# Patient Record
Sex: Female | Born: 1980 | Race: Black or African American | Hispanic: No | Smoking: Current every day smoker
Health system: Southern US, Community
[De-identification: ages and names within clinical notes are randomized; demographics above are authoritative.]

## PROBLEM LIST (undated history)

## (undated) HISTORY — PX: TUBAL LIGATION: SHX77

---

## 2004-11-08 ENCOUNTER — Inpatient Hospital Stay: Payer: Self-pay

## 2005-12-05 ENCOUNTER — Emergency Department: Payer: Self-pay | Admitting: Unknown Physician Specialty

## 2005-12-08 ENCOUNTER — Emergency Department: Payer: Self-pay | Admitting: Emergency Medicine

## 2006-09-17 ENCOUNTER — Emergency Department (HOSPITAL_COMMUNITY): Admission: EM | Admit: 2006-09-17 | Discharge: 2006-09-17 | Payer: Self-pay | Admitting: Emergency Medicine

## 2006-09-22 ENCOUNTER — Emergency Department (HOSPITAL_COMMUNITY): Admission: EM | Admit: 2006-09-22 | Discharge: 2006-09-22 | Payer: Self-pay | Admitting: Emergency Medicine

## 2007-09-22 ENCOUNTER — Emergency Department: Payer: Self-pay | Admitting: Emergency Medicine

## 2008-11-04 ENCOUNTER — Emergency Department (HOSPITAL_COMMUNITY): Admission: EM | Admit: 2008-11-04 | Discharge: 2008-11-04 | Payer: Self-pay | Admitting: Emergency Medicine

## 2009-05-04 ENCOUNTER — Emergency Department: Payer: Self-pay | Admitting: Emergency Medicine

## 2009-11-26 ENCOUNTER — Emergency Department: Payer: Self-pay | Admitting: Emergency Medicine

## 2010-07-06 ENCOUNTER — Emergency Department (HOSPITAL_COMMUNITY): Admission: EM | Admit: 2010-07-06 | Discharge: 2010-07-06 | Payer: Self-pay | Admitting: Family Medicine

## 2010-07-06 ENCOUNTER — Emergency Department: Payer: Self-pay | Admitting: Emergency Medicine

## 2011-12-19 ENCOUNTER — Emergency Department: Payer: Self-pay

## 2012-01-08 ENCOUNTER — Emergency Department: Payer: Self-pay | Admitting: Unknown Physician Specialty

## 2013-06-09 ENCOUNTER — Encounter (HOSPITAL_COMMUNITY): Payer: Self-pay | Admitting: Emergency Medicine

## 2013-06-09 ENCOUNTER — Emergency Department (HOSPITAL_COMMUNITY): Payer: Self-pay

## 2013-06-09 ENCOUNTER — Emergency Department (HOSPITAL_COMMUNITY)
Admission: EM | Admit: 2013-06-09 | Discharge: 2013-06-09 | Disposition: A | Discharge status: Home / Self Care | Payer: Self-pay | Attending: Emergency Medicine | Admitting: Emergency Medicine

## 2013-06-09 DIAGNOSIS — Y939 Activity, unspecified: Secondary | ICD-10-CM | POA: Insufficient documentation

## 2013-06-09 DIAGNOSIS — W108XXA Fall (on) (from) other stairs and steps, initial encounter: Secondary | ICD-10-CM | POA: Insufficient documentation

## 2013-06-09 DIAGNOSIS — Y929 Unspecified place or not applicable: Secondary | ICD-10-CM | POA: Insufficient documentation

## 2013-06-09 DIAGNOSIS — X500XXA Overexertion from strenuous movement or load, initial encounter: Secondary | ICD-10-CM | POA: Insufficient documentation

## 2013-06-09 DIAGNOSIS — M25561 Pain in right knee: Secondary | ICD-10-CM

## 2013-06-09 DIAGNOSIS — S8990XA Unspecified injury of unspecified lower leg, initial encounter: Secondary | ICD-10-CM | POA: Insufficient documentation

## 2013-06-09 DIAGNOSIS — F17200 Nicotine dependence, unspecified, uncomplicated: Secondary | ICD-10-CM | POA: Insufficient documentation

## 2013-06-09 MED ORDER — HYDROCODONE-ACETAMINOPHEN 5-325 MG PO TABS: 1.0000 | ORAL_TABLET | ORAL | Status: DC | PRN

## 2013-06-09 NOTE — ED Notes (Signed)
Pt transported to xray 

## 2013-06-09 NOTE — ED Notes (Signed)
Pt. Stated, I fell last night and hurt my rt. Knee.

## 2013-06-09 NOTE — ED Provider Notes (Signed)
CSN: [id number]     Arrival date & time 06/09/13  1416 History     First MD Initiated Contact with Patient 06/09/13 1425     Chief Complaint  Patient presents with  . Knee Pain   (Consider location/radiation/quality/duration/timing/severity/associated sxs/prior Treatment) HPI Comments: Patient reports she accidentally fell down 2-3 steps last night.  States her right knee twisted as she fell.  Reports pain and swelling of the right knee.  Denies weakness or numbness of the knee.  Denies hitting head or LOC.  Denies back or neck pain.  Pt took two aspirin with little relief.  Pt drove up from [place] today to be seen.    Patient is a 32 y.o. female presenting with knee pain. The history is provided by the patient.  Knee Pain Associated symptoms: no back pain, no fever and no neck pain     History reviewed. No pertinent past medical history. History reviewed. No pertinent past surgical history. No family history on file. History  Substance Use Topics  . Smoking status: Current Every Day Smoker  . Smokeless tobacco: Not on file  . Alcohol Use: Yes   OB History   Grav Para Term Preterm Abortions TAB SAB Ect Mult Living                 Review of Systems  Constitutional: Negative for fever.  HENT: Negative for neck pain.   Musculoskeletal: Positive for arthralgias. Negative for back pain.  Skin: Negative for color change.  Neurological: Negative for weakness and numbness.    Allergies  Review of patient's allergies indicates not on file.  Home Medications   Current Outpatient Rx  Name  Route  Sig  Dispense  Refill  . Aspirin-Caffeine (BC FAST PAIN RELIEF PO)   Oral   Take 2 packets by mouth as needed (pain).          BP 108/68  Pulse 83  Temp(Src) 97.7 F (36.5 C) (Oral)  Resp 15  SpO2 98%  LMP 05/26/2013 Physical Exam  Nursing note and vitals reviewed. Constitutional: She appears well-developed and well-nourished. No distress.  HENT:  Head: Normocephalic  and atraumatic.  Neck: Neck supple.  Pulmonary/Chest: Effort normal.  Musculoskeletal:       Right knee: She exhibits decreased range of motion and swelling. She exhibits no ecchymosis, no deformity, no laceration, normal alignment, no LCL laxity and no MCL laxity. Tenderness found. Medial joint line and lateral joint line tenderness noted.       Legs: Decreased AROM.  Tender to palpation medial and lateral to the patella.  Pain with valgus stress.  No pain or laxity with anterior or posterior drawer or varus stress.  Distal pulses and sensation intact.    Neurological: She is alert.  Skin: She is not diaphoretic.    ED Course   Procedures (including critical care time)  Labs Reviewed - No data to display Dg Knee Complete 4 Views Right  06/09/2013   *RADIOLOGY REPORT*  Clinical Data: Larey Seat down stairs.  Right knee pain.  RIGHT KNEE - COMPLETE 4+ VIEW  Comparison: None.  Findings: No fracture.  The knee joint is normally spaced and aligned.  There is no joint effusion.  The soft tissues are unremarkable.  IMPRESSION: Normal right knee radiographs.   Original Report Authenticated By: Amie Portland, M.D.   1. Right knee pain     MDM  Pt with knee injury yesterday - twisted and fell.  Neurovascularly intact.  Xray negative.  Pt placed in knee sleeve, crutches, RICE treatment at home. Ortho follow up  Discussed all results,  findings, treatment, follow up  with patient.  Pt given return precautions.  Pt verbalizes understanding and agrees with plan.     Trixie Dredge, PA-C 06/09/13 1529

## 2013-06-10 NOTE — ED Provider Notes (Signed)
History/physical exam/procedure(s) were performed by non-physician practitioner and as supervising physician I was immediately available for consultation/collaboration. I have reviewed all notes and am in agreement with care and plan.   Keyry Iracheta S Teriyah Purington, MD 06/10/13 1202 

## 2015-08-24 ENCOUNTER — Encounter (HOSPITAL_COMMUNITY): Payer: Self-pay | Admitting: *Deleted

## 2015-08-24 ENCOUNTER — Emergency Department (HOSPITAL_COMMUNITY): Payer: Medicaid Other

## 2015-08-24 ENCOUNTER — Emergency Department (HOSPITAL_COMMUNITY)
Admission: EM | Admit: 2015-08-24 | Discharge: 2015-08-24 | Disposition: A | Discharge status: Home / Self Care | Payer: Medicaid Other | Attending: Emergency Medicine | Admitting: Emergency Medicine

## 2015-08-24 DIAGNOSIS — Y9301 Activity, walking, marching and hiking: Secondary | ICD-10-CM | POA: Diagnosis not present

## 2015-08-24 DIAGNOSIS — S83209A Unspecified tear of unspecified meniscus, current injury, unspecified knee, initial encounter: Secondary | ICD-10-CM

## 2015-08-24 DIAGNOSIS — S83511A Sprain of anterior cruciate ligament of right knee, initial encounter: Secondary | ICD-10-CM | POA: Diagnosis not present

## 2015-08-24 DIAGNOSIS — W010XXA Fall on same level from slipping, tripping and stumbling without subsequent striking against object, initial encounter: Secondary | ICD-10-CM | POA: Insufficient documentation

## 2015-08-24 DIAGNOSIS — Z720 Tobacco use: Secondary | ICD-10-CM | POA: Insufficient documentation

## 2015-08-24 DIAGNOSIS — S8991XA Unspecified injury of right lower leg, initial encounter: Secondary | ICD-10-CM | POA: Diagnosis present

## 2015-08-24 DIAGNOSIS — Y999 Unspecified external cause status: Secondary | ICD-10-CM | POA: Insufficient documentation

## 2015-08-24 DIAGNOSIS — S83206A Unspecified tear of unspecified meniscus, current injury, right knee, initial encounter: Secondary | ICD-10-CM | POA: Insufficient documentation

## 2015-08-24 DIAGNOSIS — M25561 Pain in right knee: Secondary | ICD-10-CM

## 2015-08-24 DIAGNOSIS — Y9289 Other specified places as the place of occurrence of the external cause: Secondary | ICD-10-CM | POA: Insufficient documentation

## 2015-08-24 MED ORDER — IBUPROFEN 800 MG PO TABS: 800.0000 mg | ORAL_TABLET | Freq: Three times a day (TID) | ORAL | Status: AC

## 2015-08-24 MED ORDER — KETOROLAC TROMETHAMINE 60 MG/2ML IM SOLN
60.0000 mg | Freq: Once | INTRAMUSCULAR | Status: AC
  Administered 2015-08-24: 60 mg via INTRAMUSCULAR
  Filled 2015-08-24: qty 2

## 2015-08-24 MED ORDER — HYDROCODONE-ACETAMINOPHEN 5-325 MG PO TABS: 2.0000 | ORAL_TABLET | ORAL | Status: DC | PRN

## 2015-08-24 NOTE — ED Provider Notes (Signed)
Patient care signed out to me by Haynes DageHannah Patel Mills, PA-C. Please see her note for further detail. Plan is for follow-up on pending MRI. If there is substantial ligament damage, will need orthopedic follow-up, otherwise patent may be placed in knee immobilizer with crutches and pain medications. Filed Vitals:   08/24/15 1336  BP: 105/63  Pulse: 94  Temp: 98.2 F (36.8 C)  Resp: 18   Results of MRI shows extensive tearing of both medial and lateral menisci, complete anterior cruciate ligament tear with joint effusion. Grade 1 sprain of MCL. Will place patient in knee immobilizer, give crutches, short course pain medicines and referral to follow up with orthopedic surgery within the next 1-2 days. Patient verbalizes understanding. She voices no other questions or concerns at this time. Given strict return precautions.  Joycie PeekBenjamin Tareq Dwan, PA-C 08/24/15 1800  Pricilla LovelessScott Goldston, MD 08/26/15 [phone]

## 2015-08-24 NOTE — ED Provider Notes (Signed)
CSN: [id number]     Arrival date & time 08/24/15  1309 History  By signing my name below, I, Essence Howell, attest that this documentation has been prepared under the direction and in the presence of Catha GosselinHanna Patel-Mills, PA-C Electronically Signed: Charline BillsEssence Howell, ED Scribe 08/25/2015 at 7:12 AM.   Chief Complaint  Patient presents with  . Knee Injury   The history is provided by the patient. No language interpreter was used.   HPI Comments: Eugenia Mcalpineakeel Shantel Maertens is a 34 y.o. female who presents to the Emergency Department complaining of a right knee injury sustained 2 days ago. Pt states that she was walking when she slipped and fell backwards. She reports that she felt and heard a "pop" in her right knee during the fall. She further reports constant right knee pain that is exacerbated with movement and palpation, as well as right knee swelling. She has been non-weight bearing for the past 2 days. Pt has tried Aleve without relief.   History reviewed. No pertinent past medical history. Past Surgical History  Procedure Laterality Date  . Tubal ligation     No family history on file. Social History  Substance Use Topics  . Smoking status: Current Every Day Smoker  . Smokeless tobacco: None  . Alcohol Use: Yes   OB History    No data available     Review of Systems  Musculoskeletal: Positive for joint swelling and arthralgias.  All other systems reviewed and are negative.  Allergies  Review of patient's allergies indicates no known allergies.  Home Medications   Prior to Admission medications   Medication Sig Start Date End Date Taking? Authorizing Provider  HYDROcodone-acetaminophen (NORCO) 5-325 MG tablet Take 2 tablets by mouth every 4 (four) hours as needed. 08/24/15   Joycie PeekBenjamin Cartner, PA-C  ibuprofen (ADVIL,MOTRIN) 800 MG tablet Take 1 tablet (800 mg total) by mouth 3 (three) times daily. 08/24/15   Benjamin Cartner, PA-C   BP 105/63 mmHg  Pulse 94  Temp(Src) 98.2 F (36.8  C) (Oral)  Resp 18  SpO2 100%  LMP 08/04/2015 Physical Exam  Constitutional: She is oriented to person, place, and time. She appears well-developed and well-nourished. No distress.  HENT:  Head: Normocephalic and atraumatic.  Eyes: Conjunctivae and EOM are normal.  Neck: Neck supple. No tracheal deviation present.  Cardiovascular: Normal rate.   Pulmonary/Chest: Effort normal. No respiratory distress.  Musculoskeletal: Normal range of motion.  Moderate joint effusion and swelling to the R knee but no erythema or warmth. Unable to flex the knee past 90 degrees. Unable to bear weight. Tenderness to medial aspect of the knee. Able to straight leg raise but with pain. No patellar tenderness or tibial tubercle tenderness.   Neurological: She is alert and oriented to person, place, and time.  Skin: Skin is warm and dry.  Psychiatric: She has a normal mood and affect. Her behavior is normal.  Nursing note and vitals reviewed.  ED Course  Procedures (including critical care time) DIAGNOSTIC STUDIES: Oxygen Saturation is 100% on RA, normal by my interpretation.    COORDINATION OF CARE: 2:27 PM-Discussed treatment plan which includes XR with pt at bedside and pt agreed to plan.   Labs Review Labs Reviewed - No data to display  Imaging Review Mr Knee Right Wo Contrast  08/24/2015  CLINICAL DATA:  Status post fall 2 days ago with a twisting injury of the right knee. Question ligament tear. Initial encounter. EXAM: MRI OF THE RIGHT KNEE WITHOUT CONTRAST  TECHNIQUE: Multiplanar, multisequence MR imaging of the knee was performed. No intravenous contrast was administered. COMPARISON:  Plain films earlier this same day. FINDINGS: MENISCI Medial meniscus: There is complex tearing in the posterior horn of the medial meniscus. The tear has a large horizontal component reaching the meniscal undersurface and there is a radial component peripheral to the meniscal root. The body of the medial meniscus is  extruded peripherally. Lateral meniscus: The patient has a bucket-handle tear of the lateral meniscus with a large meniscal fragment flipped centrally and anterior to the anterior horn. LIGAMENTS Cruciates: The anterior cruciate ligament is completely torn. The posterior cruciate ligament is intact. Collaterals: Mild edema is seen about the medial collateral ligament consistent with sprain without tear. Lateral collateral ligament complex is intact. CARTILAGE Patellofemoral:  Unremarkable. Medial: There is focal thinning and irregularity of hyaline cartilage along the posterior aspect of the weight-bearing surface the medial femoral condyle extending 1.1 cm AP by 0.5 cm transverse. Lateral:  Unremarkable. Joint:  Large joint effusion is seen. Popliteal Fossa:  Intact. Extensor Mechanism:  No Baker's cyst. Bones:  Bone contusions without fracture are seen about the knee. IMPRESSION: Extensive tearing of both the medial and lateral menisci as described above. Complete ACL tear with associated bone contusions and joint effusion. Mild, grade 1 sprain of the medial collateral ligament. 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 Electronically Signed   By: Drusilla Kanner M.D.   On: 08/24/2015 17:27   Dg Knee Complete 4 Views Right  08/24/2015  CLINICAL DATA:  Pain following fall 2 days prior EXAM: RIGHT KNEE - COMPLETE 4+ VIEW COMPARISON:  June 09, 2013 FINDINGS: Frontal, lateral, and bilateral oblique views were obtained. There is no demonstrable fracture or dislocation. There is a joint effusion present. The joint spaces appear intact. No erosive change. IMPRESSION: There is a joint effusion present. No fracture apparent. No appreciable joint space narrowing. Electronically Signed   By: Bretta Bang III M.D.   On: 08/24/2015 14:22   I have personally reviewed and evaluated these image results as part of my medical decision-making.   EKG Interpretation None       MDM   Final diagnoses:  Knee pain, acute, right  Anterior cruciate ligament tear, right, initial encounter  Acute meniscal tear of knee, unspecified laterality, initial encounter  Patient presents for right knee pain after falling. She is unable to bear weight and has significant pain with straight leg raise. She has moderate swelling on exam. X-ray of right knee shows joint effusion. I do not believe the patient needs a knee tap at this time. This is not infectious and is most likely due to a mechanical injury. I suspect that she has torn a ligament. An MRI of the knee has been ordered.  I discussed this patient with Cala Bradford, PA-C and the plan is to place the patient in a knee immobilizer with crutches.  She will need narcotic pain medication to go home with. She will also need orthopedic follow-up. If the MRI shows a ligament tear, orthopedics should be called and she should have close follow-up.  I personally performed the services described in this documentation, which was scribed in my presence. The recorded information has been reviewed and is accurate.    Catha Gosselin, PA-C 08/25/15 1610  Laurence Spates, MD 08/26/15 [phone]

## 2015-08-24 NOTE — Discharge Instructions (Signed)
You were evaluated in the ED today for your knee pain. Your found to have a complete tear of your anterior cruciate ligament. You also tore your meniscus. Please where your knee immobilizer until you can meet with the orthopedic surgeon. Please call the orthopedic surgeon, Dr. Winona LegatoSwintec, tomorrow in order to establish a reevaluation appointment. Return to ED for any new or worsening symptoms. Please take her pain medicines as prescribed, do not take these medications while driving or operating machinery  Anterior Cruciate Ligament Reconstruction Anterior cruciate ligament (ACL) reconstruction is a surgical procedure to replace a torn ACL. A ligament is a strong, fibrous, cordlike tissue that connects your bones. Your ACL guides your shin bone (tibia) as it moves in your knee joint. When your ACL is torn, your knee joint loses stability. During ACL reconstruction, a tissue from a ligament from another part of your body or from a donor (graft) is used to replace your torn ACL. LET [facility]OUR [facility] PROVIDER KNOW ABOUT:   Any allergies you have.  Any medicine you are taking, including vitamins, herbs, eye drops, over-the-counter medicines, and creams.  Problems you have had with numbing medicines (local anesthetics) or medicines that make you sleep (general anesthetics).  A family history of problems with anesthetics.  Any blood disorders you have or history of excessive bleeding you have.  Previous surgeries you have had.  Any history of tobacco use.  Any long-term health conditions you have, such as diabetes. RISKS AND COMPLICATIONS Generally, ACL reconstruction is a safe procedure. The following complications are very rare, but they can occur:  Infection.  Failure or repeated rupture of the reconstructed ligament.  Knee stiffness. BEFORE THE PROCEDURE  Your health care provider will instruct you when you need to stop eating and drinking.  Ask your health care provider if you need to  change or stop taking any medicines that you regularly take. PROCEDURE  A small incision will be made in your knee. A thin, flexible, tube-like surgical instrument with a camera on the end (arthroscope) will be inserted into your knee joint. Your torn ACL will be removed with the arthroscope. The graft will be inserted into your knee joint with the arthroscope. Guides will be used to place the graft in the proper position. Tunnels in your upper tibia and femur will be created with large drills, which are placed over guidewires. The graft will be pulled into the tunnels on both your femur and your tibia. The graft will be secured in place in the tunnels with screws.  AFTER THE PROCEDURE You will be taken to the recovery area where a nurse will watch and check your progress. You will be allowed to leave with a family member or other adult who will stay with you once your health care provider feels it is safe for you to go home.    This information is not intended to replace advice given to you by your health care provider. Make sure you discuss any questions you have with your health care provider.   Document Released: 08/02/2004 Document Revised: 10/24/2014 Document Reviewed: 02/20/2012 Elsevier Interactive Patient Education 2016 [employer]  Meniscus Injury, Arthroscopy Arthroscopy is a surgical procedure that involves the use of a small scope that has a camera and surgical instruments on the end (arthroscope). An arthroscope can be used to repair your meniscus injury.  LET [facility] PROVIDER KNOW ABOUT:  Any allergies you have.  All medicines you are taking, including vitamins, herbs, eyedrops, creams,  and over-the-counter medicines.  Any recent colds or infections you have had or currently have.  Previous problems you or members of your family have had with the use of anesthetics.  Any blood disorders or blood clotting problems you have.  Previous surgeries you have  had.  Medical conditions you have. RISKS AND COMPLICATIONS Generally, this is a safe procedure. However, as with any procedure, problems can occur. Possible problems include:  Damage to nerves or blood vessels.  Excess bleeding.  Blood clots.  Infection. BEFORE THE PROCEDURE  Do not eat or drink for 6-8 hours before the procedure.  Take medicines as directed by your surgeon. Ask your surgeon about changing or stopping your regular medicines.  You may have lab tests the morning of surgery. PROCEDURE  You will be given one of the following:   A medicine that numbs the area (local anesthesia).  A medicine that makes you go to sleep (general anesthesia).  A medicine injected into your spine that numbs your body below the waist (spinal anesthesia). Most often, several small cuts (incisions) are made in the knee. The arthroscope and instruments go into the incisions to repair the damage. The torn portion of the meniscus is removed.  During this time, your surgeon may find a partial or complete tear in a cruciate ligament, such as the anterior cruciate ligament (ACL). A completely torn cruciate ligament is reconstructed by taking tissue from another part of the body (grafting) and placing it into the injured area. This requires several larger incisions to complete the repair. Sometimes, open surgery is needed for collateral ligament injuries. If a collateral ligament is found to be injured, your surgeon may staple or suture the tear through a slightly larger incision on the side of the knee. AFTER THE PROCEDURE You will be taken to the recovery area where your progress will be monitored. When you are awake, stable, and taking fluids without complications, you will be allowed to go home. This is usually the same day. However, more extensive repairs of a ligament may require an overnight stay.  The recovery time after repairing your meniscus or ligament depends on the amount of damage to these  structures. It also depends on whether or not reconstructive knee surgery was needed.   A torn or stretched ligament (ligament sprain) may take 6-8 weeks to heal. It takes about the same amount of time if your surgeon removed a torn meniscus.  A repaired meniscus may require 6-12 weeks of recovery time.  A torn ligament needing reconstructive surgery may take 6-12 months to heal fully.   This information is not intended to replace advice given to you by your health care provider. Make sure you discuss any questions you have with your health care provider.   Document Released: 09/30/2000 Document Revised: 10/08/2013 Document Reviewed: 03/01/2013 Elsevier Interactive Patient Education [employer]

## 2015-08-24 NOTE — ED Notes (Signed)
Pt is here after falling and injury to right knee.

## 2016-07-07 ENCOUNTER — Other Ambulatory Visit: Payer: Medicaid Other

## 2016-07-14 ENCOUNTER — Ambulatory Visit: Admit: 2016-07-14 | Payer: Medicaid Other | Admitting: Surgery

## 2016-07-14 SURGERY — KNEE ARTHROSCOPY WITH ANTERIOR CRUCIATE LIGAMENT (ACL) REPAIR
Anesthesia: Choice | Laterality: Right

## 2016-08-29 IMAGING — DX DG KNEE COMPLETE 4+V*R*
4 series · 4 of 4 positions shown · non-contrast
Comparison: June 09, 2013

CLINICAL DATA: Pain following fall 2 days prior

EXAM:
RIGHT KNEE - COMPLETE 4+ VIEW

[t knee ap right]
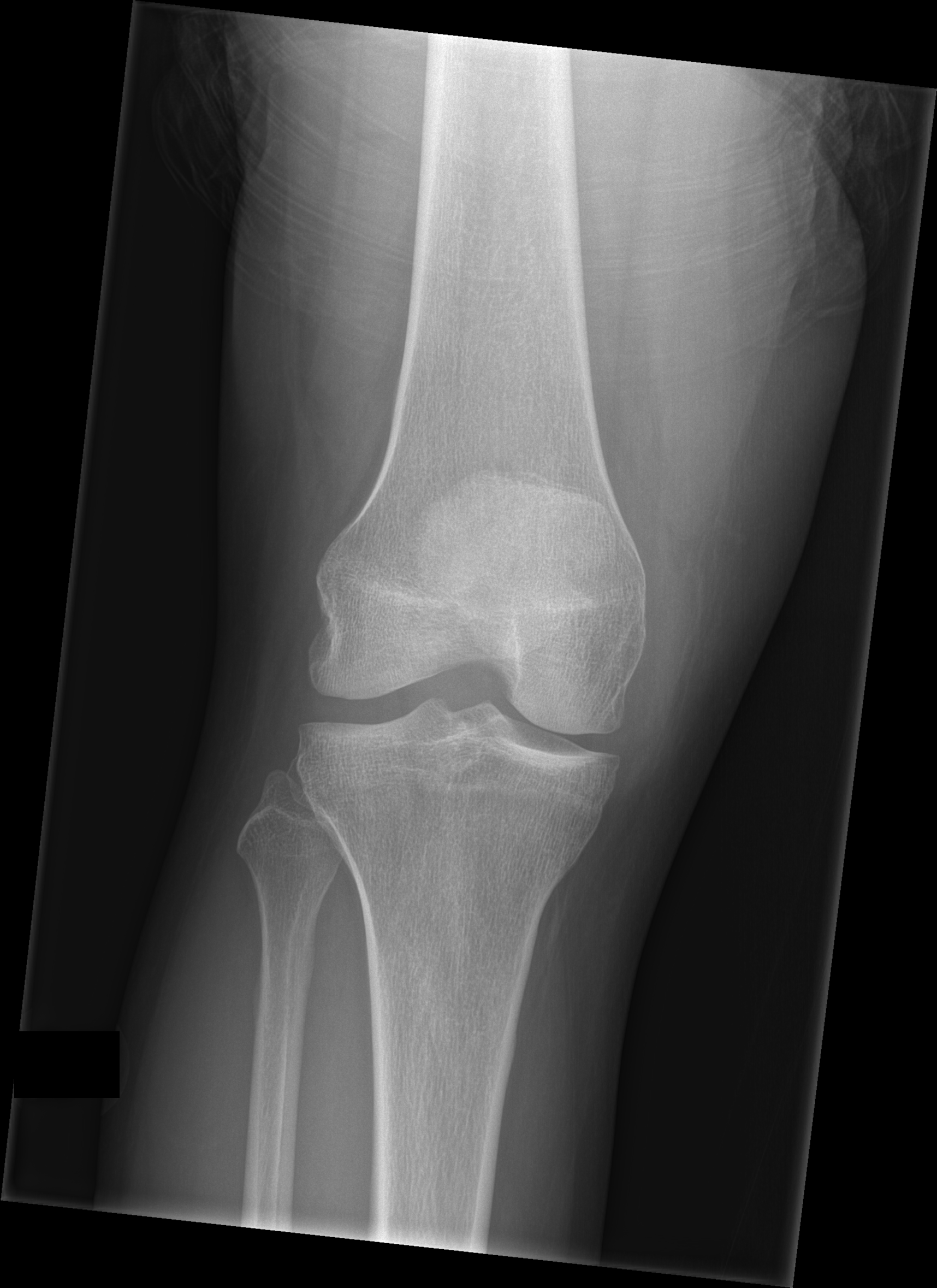

[t knee obl right (1 of 2)]
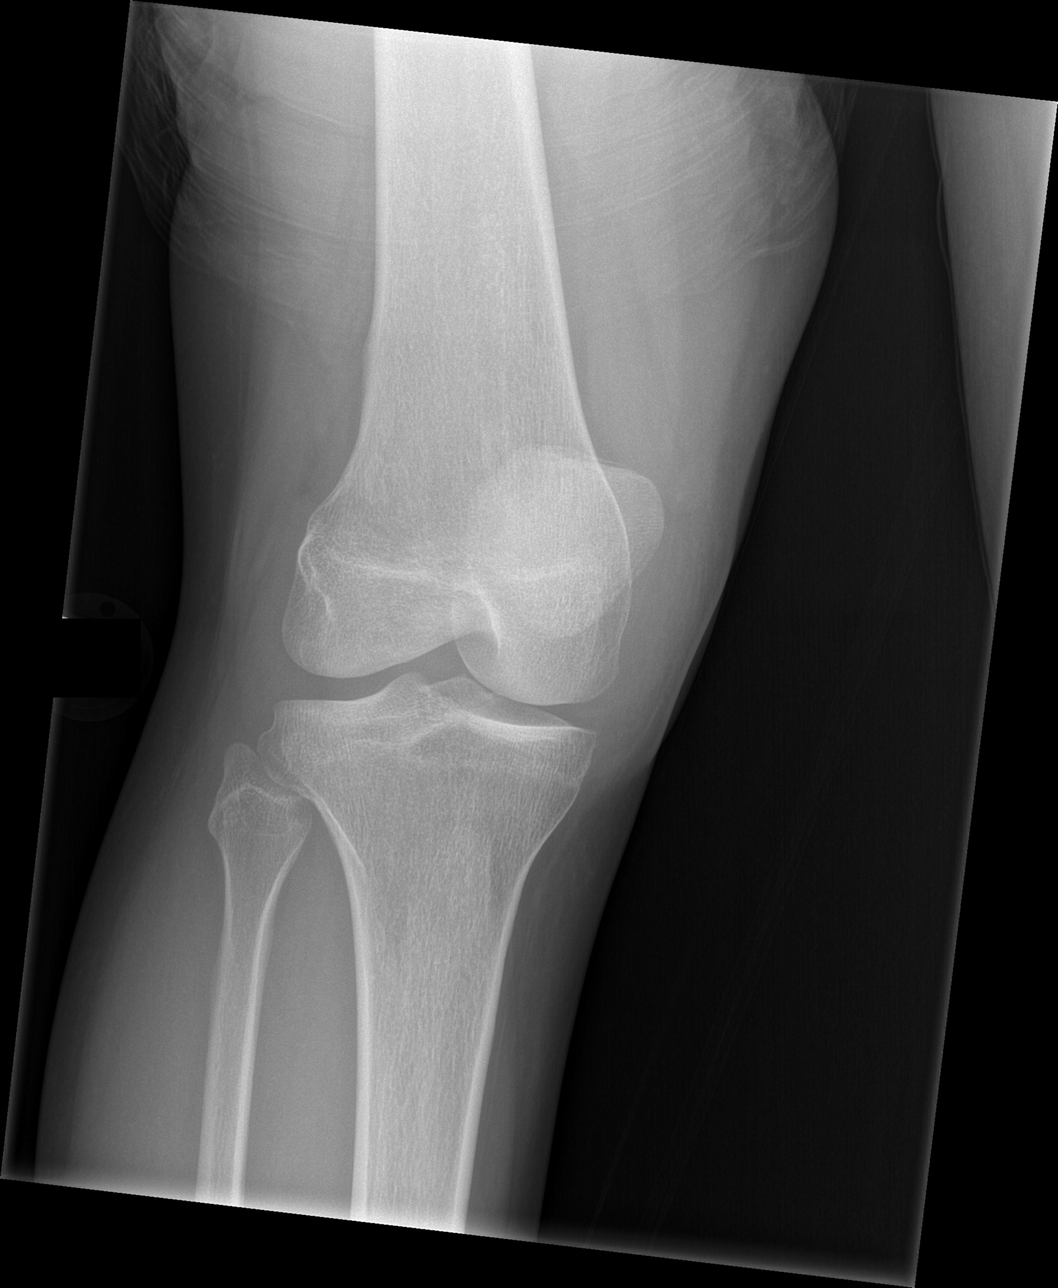

[t knee obl right (2 of 2)]
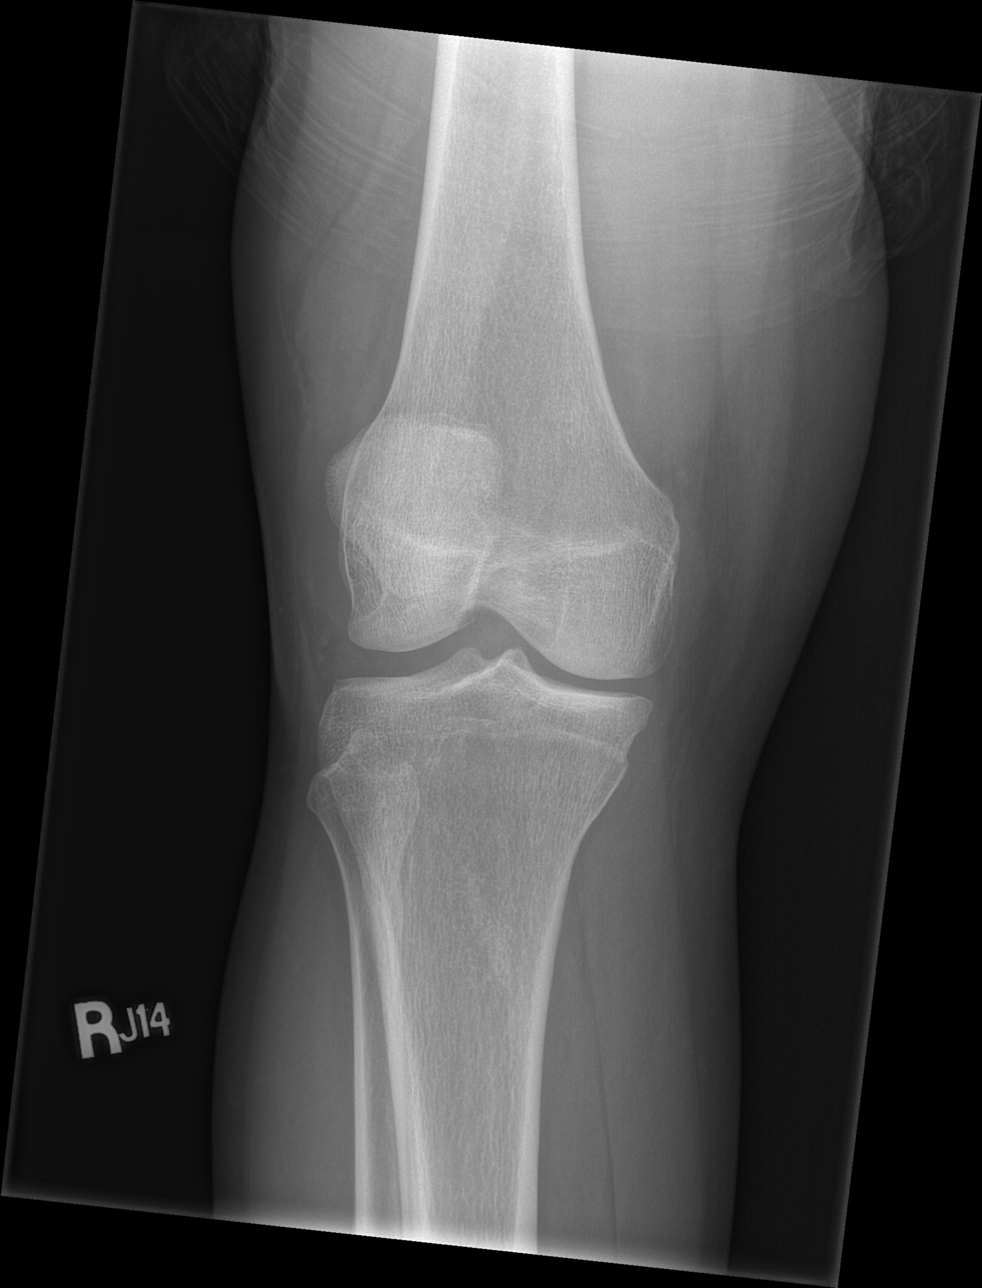

[t knee lat right]
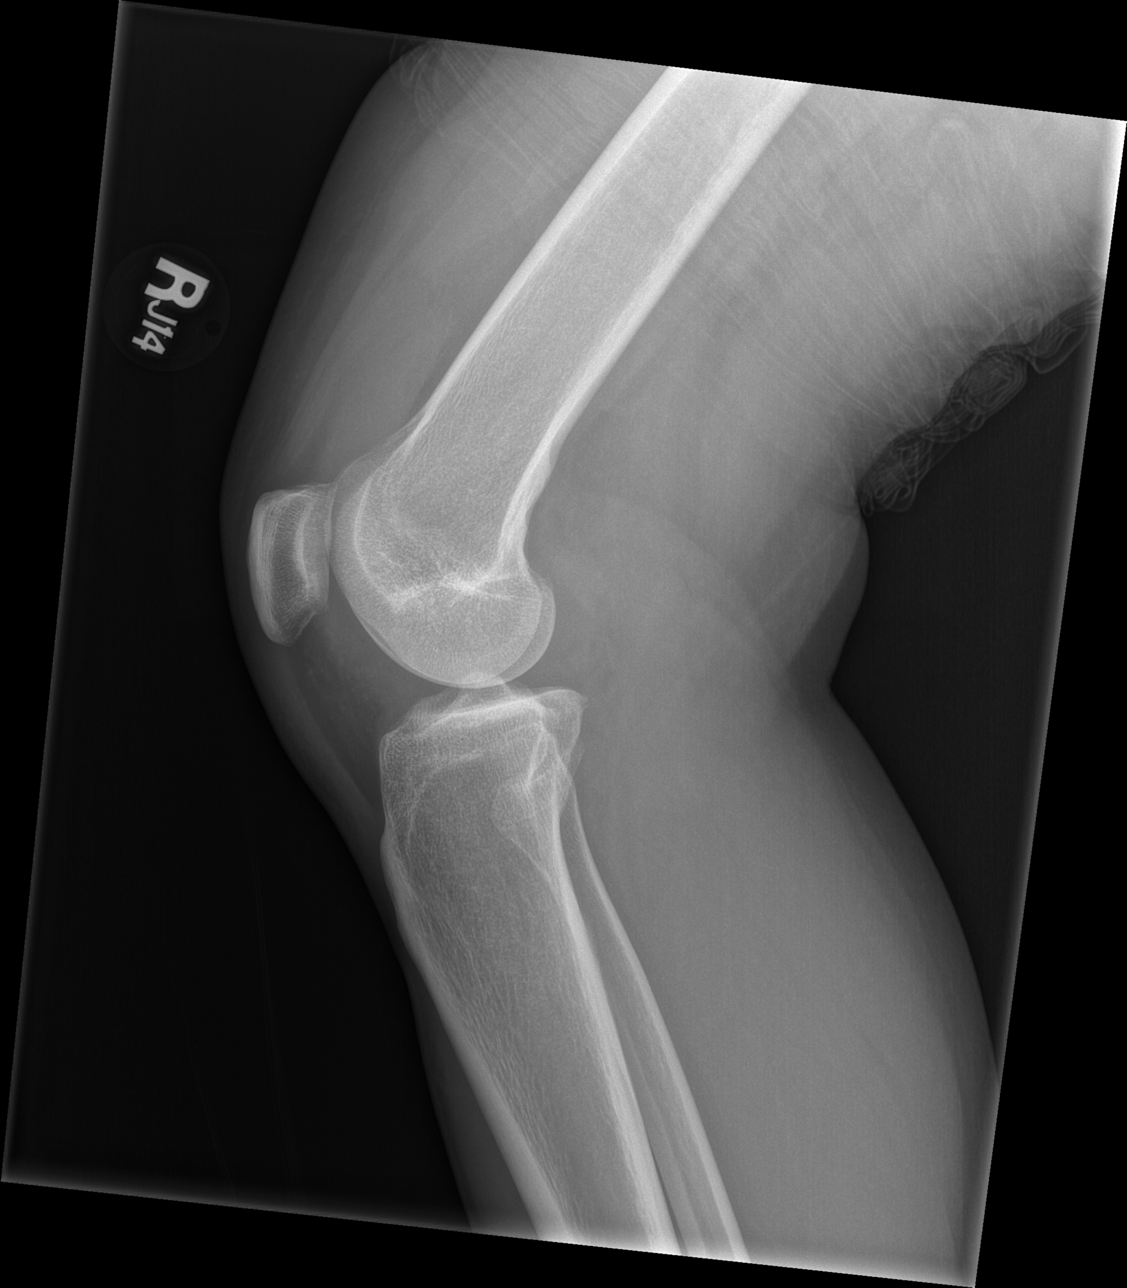

[4 of 4 positions shown; findings below may reference images not displayed]

FINDINGS: Frontal, lateral, and bilateral oblique views were obtained. There
is no demonstrable fracture or dislocation. There is a joint
effusion present. The joint spaces appear intact. No erosive change.
IMPRESSION: There is a joint effusion present. No fracture apparent. No
appreciable joint space narrowing.

## 2016-12-10 ENCOUNTER — Encounter: Payer: Self-pay | Admitting: Emergency Medicine

## 2016-12-10 ENCOUNTER — Emergency Department
Admission: EM | Admit: 2016-12-10 | Discharge: 2016-12-10 | Disposition: A | Discharge status: Home / Self Care | Payer: Medicaid Other | Attending: Emergency Medicine | Admitting: Emergency Medicine

## 2016-12-10 DIAGNOSIS — F17200 Nicotine dependence, unspecified, uncomplicated: Secondary | ICD-10-CM | POA: Insufficient documentation

## 2016-12-10 DIAGNOSIS — J111 Influenza due to unidentified influenza virus with other respiratory manifestations: Secondary | ICD-10-CM | POA: Insufficient documentation

## 2016-12-10 DIAGNOSIS — R05 Cough: Secondary | ICD-10-CM | POA: Diagnosis present

## 2016-12-10 DIAGNOSIS — Z791 Long term (current) use of non-steroidal anti-inflammatories (NSAID): Secondary | ICD-10-CM | POA: Insufficient documentation

## 2016-12-10 MED ORDER — IBUPROFEN 600 MG PO TABS
ORAL_TABLET | ORAL | Status: AC
  Administered 2016-12-10: 800 mg via ORAL
  Filled 2016-12-10: qty 1

## 2016-12-10 MED ORDER — ACETAMINOPHEN 325 MG PO TABS
ORAL_TABLET | ORAL | Status: AC
  Filled 2016-12-10: qty 2

## 2016-12-10 MED ORDER — ONDANSETRON HCL 4 MG PO TABS
4.0000 mg | ORAL_TABLET | Freq: Once | ORAL | Status: AC
  Administered 2016-12-10: 4 mg via ORAL
  Filled 2016-12-10: qty 1

## 2016-12-10 MED ORDER — FLUTICASONE PROPIONATE 50 MCG/ACT NA SUSP: 2.0000 | Freq: Every day | NASAL | 0 refills | Status: AC

## 2016-12-10 MED ORDER — IBUPROFEN 400 MG PO TABS
ORAL_TABLET | ORAL | Status: AC
  Filled 2016-12-10: qty 1

## 2016-12-10 MED ORDER — IBUPROFEN 800 MG PO TABS
800.0000 mg | ORAL_TABLET | Freq: Once | ORAL | Status: AC
  Administered 2016-12-10: 800 mg via ORAL

## 2016-12-10 MED ORDER — OSELTAMIVIR PHOSPHATE 75 MG PO CAPS: 75.0000 mg | ORAL_CAPSULE | Freq: Two times a day (BID) | ORAL | 0 refills | Status: AC

## 2016-12-10 MED ORDER — ONDANSETRON HCL 4 MG PO TABS: 4.0000 mg | ORAL_TABLET | Freq: Every day | ORAL | 0 refills | Status: AC | PRN

## 2016-12-10 MED ORDER — ACETAMINOPHEN 325 MG PO TABS
650.0000 mg | ORAL_TABLET | Freq: Once | ORAL | Status: AC | PRN
  Administered 2016-12-10: 650 mg via ORAL

## 2016-12-10 NOTE — ED Notes (Addendum)
Pt sleeping. 

## 2016-12-10 NOTE — ED Triage Notes (Signed)
Pt son had flu. Started last night with chills, body aches, nausea, congestion, and cough. Reports vomited in lobby but just clear saliva in bag. No diarrhea. Has also had headache.

## 2016-12-10 NOTE — ED Provider Notes (Signed)
[facility] Emergency Department Provider Note  ____________________________________________  Time seen: Approximately 4:41 PM  I have reviewed the triage vital signs and the nursing notes.   HISTORY  Chief Complaint Influenza    HPI Carly Shepard is a 36 y.o. female that presents to the emergency department with one day of fever, chills, headache, muscle aches, nasal congestion, cough, nausea, vomiting. Patient states that she is coughing up white phlegm. Patient is staying well hydrated. She has drank several bottles of water today. No change in urination. Patient did not receive flu shot this year. Patient has not taken anything for symptoms. Patient denies shortness of breath, chest pain, abdominal pain.   History reviewed. No pertinent past medical history.  There are no active problems to display for this patient.   Past Surgical History:  Procedure Laterality Date  . TUBAL LIGATION      Prior to Admission medications   Medication Sig Start Date End Date Taking? Authorizing Provider  fluticasone (FLONASE) 50 MCG/ACT nasal spray Place 2 sprays into both nostrils daily. 12/10/16 12/10/17  Enid DerryAshley Glendon Dunwoody, PA-C  HYDROcodone-acetaminophen (NORCO) 5-325 MG tablet Take 2 tablets by mouth every 4 (four) hours as needed. 08/24/15   Joycie PeekBenjamin Cartner, PA-C  ibuprofen (ADVIL,MOTRIN) 800 MG tablet Take 1 tablet (800 mg total) by mouth 3 (three) times daily. 08/24/15   Joycie PeekBenjamin Cartner, PA-C  ondansetron (ZOFRAN) 4 MG tablet Take 1 tablet (4 mg total) by mouth daily as needed for nausea or vomiting. 12/10/16 12/10/17  Enid DerryAshley Emmerson Shuffield, PA-C  oseltamivir (TAMIFLU) 75 MG capsule Take 1 capsule (75 mg total) by mouth 2 (two) times daily. 12/10/16 12/15/16  Enid DerryAshley Carlosdaniel Grob, PA-C    Allergies Patient has no known allergies.  History reviewed. No pertinent family history.  Social History Social History  Substance Use Topics  . Smoking status: Current Every Day Smoker  .  Smokeless tobacco: Never Used  . Alcohol use Yes     Review of Systems  Eyes: No visual changes. No discharge. ENT: Positive for congestion and rhinorrhea. Cardiovascular: No chest pain. Respiratory: Positive for cough. No SOB. Gastrointestinal: No abdominal pain. No diarrhea.  No constipation. Musculoskeletal: Positive for musculoskeletal pain. Skin: Negative for rash, abrasions, lacerations, ecchymosis. Neurological: Positive for headaches.   ____________________________________________   PHYSICAL EXAM:  VITAL SIGNS: ED Triage Vitals  Enc Vitals Group     BP 12/10/16 1605 119/74     Pulse Rate 12/10/16 1603 (!) 104     Resp 12/10/16 1603 20     Temp 12/10/16 1603 (!) 103.2 F (39.6 C)     Temp Source 12/10/16 1603 Oral     SpO2 12/10/16 1603 99 %     Weight 12/10/16 1604 167 lb (75.8 kg)     Height 12/10/16 1604 5\' 5"  (1.651 m)     Head Circumference --      Peak Flow --      Pain Score 12/10/16 1604 8     Pain Loc --      Pain Edu? --      Excl. in GC? --      Constitutional: Alert and oriented. Well appearing and in no acute distress. Eyes: Conjunctivae are normal. PERRL. EOMI. No discharge. Head: Atraumatic. ENT: No frontal and maxillary sinus tenderness.      Ears: Tympanic membranes pearly gray with good landmarks. No discharge.      Nose: Mild congestion/rhinnorhea.      Mouth/Throat: Mucous membranes are moist. Oropharynx non-erythematous.  Tonsils not enlarged. No exudates. Uvula midline. Neck: No stridor.   Hematological/Lymphatic/Immunilogical: No cervical lymphadenopathy. Cardiovascular: Normal rate, regular rhythm.  Good peripheral circulation. Respiratory: Normal respiratory effort without tachypnea or retractions. Lungs CTAB. Good air entry to the bases with no decreased or absent breath sounds. Gastrointestinal: Bowel sounds 4 quadrants. Soft and nontender to palpation. No guarding or rigidity. No palpable masses. No distention. Musculoskeletal:  Full range of motion to all extremities. No gross deformities appreciated. Neurologic:  Normal speech and language. No gross focal neurologic deficits are appreciated.  Skin:  Skin is warm, dry and intact. No rash noted. Psychiatric: Mood and affect are normal. Speech and behavior are normal. Patient exhibits appropriate insight and judgement.   ____________________________________________   LABS (all labs ordered are listed, but only abnormal results are displayed)  Labs Reviewed - No data to display ____________________________________________  EKG   ____________________________________________  RADIOLOGY  No results found.  ____________________________________________    PROCEDURES  Procedure(s) performed:    Procedures    Medications  acetaminophen (TYLENOL) 325 MG tablet (not administered)  acetaminophen (TYLENOL) tablet 650 mg (650 mg Oral Given 12/10/16 1606)     ____________________________________________   INITIAL IMPRESSION / ASSESSMENT AND PLAN / ED COURSE  Pertinent labs & imaging results that were available during my care of the patient were reviewed by me and considered in my medical decision making (see chart for details).  Review of the [place] CSRS was performed in accordance of the [facility] prior to dispensing any controlled drugs.     Patient's diagnosis is consistent with Influenza. Vital signs and exam are reassuring. Temperature came down to 100.5 from 103.2 in ED. Patient is going to continue alternating Tylenol and ibuprofen. Patient appears well and is staying well hydrated. After Zofran, patient states that she feels great and is starving. Patient is in the room eating peanut butter and graham crackers and drinking soda. She states she is ready to go home and have supper. Patient will be discharged home with prescriptions for Tamiflu, Flonase, Zofran. Patient is to follow up with PCP as needed or otherwise directed. Patient is given ED  precautions to return to the ED for any worsening or new symptoms.     ____________________________________________  FINAL CLINICAL IMPRESSION(S) / ED DIAGNOSES  Final diagnoses:  Influenza      NEW MEDICATIONS STARTED DURING THIS VISIT:  New Prescriptions   FLUTICASONE (FLONASE) 50 MCG/ACT NASAL SPRAY    Place 2 sprays into both nostrils daily.   ONDANSETRON (ZOFRAN) 4 MG TABLET    Take 1 tablet (4 mg total) by mouth daily as needed for nausea or vomiting.   OSELTAMIVIR (TAMIFLU) 75 MG CAPSULE    Take 1 capsule (75 mg total) by mouth 2 (two) times daily.        This chart was dictated using voice recognition software/Dragon. Despite best efforts to proofread, errors can occur which can change the meaning. Any change was purely unintentional.    Enid Derry, PA-C 12/10/16 1841    Jene Every, MD 12/10/16 [phone]

## 2016-12-10 NOTE — ED Notes (Signed)
Pt in with flu like symptoms after being exposed to the flu. Pt medicated in triage with tylenol. Given motrin and zofran. Pt resting comfortably in room.

## 2018-06-16 ENCOUNTER — Other Ambulatory Visit
Admission: AD | Admit: 2018-06-16 | Discharge: 2018-06-16 | Disposition: A | Discharge status: Home / Self Care | Attending: Family Medicine | Admitting: Family Medicine

## 2018-06-16 NOTE — ED Notes (Signed)
Pt arrived to ED handcuffed with officer pride for legal blood draw. Pt yelling loudly and cursing. search warrant with [employer]. cooperative and blood was taken after 2nd stick.

## 2022-03-02 ENCOUNTER — Ambulatory Visit: Payer: Self-pay | Admitting: Family Medicine

## 2022-03-02 DIAGNOSIS — Z113 Encounter for screening for infections with a predominantly sexual mode of transmission: Secondary | ICD-10-CM

## 2022-03-02 DIAGNOSIS — A64 Unspecified sexually transmitted disease: Secondary | ICD-10-CM

## 2022-03-02 DIAGNOSIS — Z202 Contact with and (suspected) exposure to infections with a predominantly sexual mode of transmission: Secondary | ICD-10-CM

## 2022-03-02 MED ORDER — CEFTRIAXONE SODIUM 500 MG IJ SOLR
500.0000 mg | Freq: Once | INTRAMUSCULAR | Status: AC
  Administered 2022-03-02: 500 mg via INTRAMUSCULAR

## 2022-03-02 MED ORDER — DOXYCYCLINE HYCLATE 100 MG PO TABS: 100.0000 mg | ORAL_TABLET | Freq: Two times a day (BID) | ORAL | 0 refills | Status: AC

## 2022-03-02 NOTE — Progress Notes (Signed)
I was consulted about this client's history and reason for her visit.  She declines interview by provider and exam.  She requests treatment for contact to [facility] and presumptive chlamydia.  She had HIV and RPR today d/t partner's RPR [facility] was reactive. ?

## 2022-03-02 NOTE — Progress Notes (Signed)
Pt is here as a contact to Gonorrhea and Chlamydia.  Her partner also tested positive for Syphilis.  Pt wants to be tested for Syphilis, GC and Chlamydia only.  Pt does not want to see a Provider or have an exam.  Medication dispensed and Ceftriaxone 500 mg given IM without any complications. Provider orders completed.  Pt informed that lab results may take 2-3 weeks.   ?

## 2022-03-11 ENCOUNTER — Ambulatory Visit: Payer: Medicaid Other

## 2022-03-15 ENCOUNTER — Telehealth: Payer: Self-pay

## 2022-03-15 ENCOUNTER — Ambulatory Visit: Payer: Self-pay | Admitting: Family Medicine

## 2022-03-15 DIAGNOSIS — Z113 Encounter for screening for infections with a predominantly sexual mode of transmission: Secondary | ICD-10-CM

## 2022-03-15 DIAGNOSIS — A539 Syphilis, unspecified: Secondary | ICD-10-CM

## 2022-03-15 MED ORDER — PENICILLIN G BENZATHINE 1200000 UNIT/2ML IM SUSY
2.4000 10*6.[IU] | PREFILLED_SYRINGE | Freq: Once | INTRAMUSCULAR | Status: AC
  Administered 2022-03-15: 2.4 10*6.[IU] via INTRAMUSCULAR

## 2022-03-15 NOTE — Telephone Encounter (Signed)
Calling pt regarding positive chlamydia result from 03/02/22 vaginal specimen.  Pt needs to be counseled about + TR, tx received at 03/02/22 visit, and needs TOC in approx 3 months.  Phone call to pt and pt confirmed password.  Counseled pt regarding the above information and pt expressed understanding.

## 2022-03-15 NOTE — Progress Notes (Signed)
I was consulted about this client's treatment plan for syphilis.

## 2022-03-15 NOTE — Progress Notes (Signed)
Pt here for Syphilis lab work and treatment.  Bicillin 2.4 MU given IM without any complications.  Pt informed not to have sex for 14 days and to have repeat labs in 6 months.  Berdie Ogren, RN

## 2022-11-15 ENCOUNTER — Other Ambulatory Visit: Payer: Self-pay

## 2023-08-11 ENCOUNTER — Emergency Department
Admission: EM | Admit: 2023-08-11 | Discharge: 2023-08-11 | Disposition: A | Discharge status: Home / Self Care | Payer: Managed Care, Other (non HMO) | Attending: Emergency Medicine | Admitting: Emergency Medicine

## 2023-08-11 ENCOUNTER — Other Ambulatory Visit: Payer: Self-pay

## 2023-08-11 DIAGNOSIS — L0231 Cutaneous abscess of buttock: Secondary | ICD-10-CM | POA: Diagnosis present

## 2023-08-11 MED ORDER — HYDROCODONE-ACETAMINOPHEN 5-325 MG PO TABS: 1.0000 | ORAL_TABLET | ORAL | 0 refills | Status: DC | PRN

## 2023-08-11 MED ORDER — SULFAMETHOXAZOLE-TRIMETHOPRIM 800-160 MG PO TABS: 1.0000 | ORAL_TABLET | Freq: Two times a day (BID) | ORAL | 0 refills | Status: AC

## 2023-08-11 NOTE — ED Triage Notes (Signed)
Pt to ED via POV c/o abscess on right buttock. Pt noticed it last week and has since gotten bigger. Pt noticed some drainage yesterday. Denies any fevers

## 2023-08-11 NOTE — ED Notes (Signed)
See triage note  Presents with abscess area to right buttocks  States she noticed the area a few days ago  Placed warm compresses on it  States it started to drain   then pain became worse  But now area is drain again  afebrile on arrival

## 2023-08-11 NOTE — Discharge Instructions (Addendum)
Please use ibuprofen (Motrin) up to 800 mg every 8 hours, naproxen (Naprosyn) up to 500 mg every 12 hours, and/or acetaminophen (Tylenol) up to 4 g/day for any continued pain.  Please do not use this medication regimen for longer than 7 days

## 2023-08-11 NOTE — ED Provider Notes (Signed)
[facility] Provider Note   Event Date/Time   First MD Initiated Contact with Patient 08/11/23 [phone]     (approximate) History  Abscess  HPI Carly Shepard is a 42 y.o. female who presents for an abscess to the right buttock is been present over the last week and just darted draining overnight last night.  Patient states that the pain has been getting worse until last night when it started draining however patient states that it has stopped draining and become more red and painful.  Patient denies any fevers or history of recurrent abscesses ROS: Patient currently denies any vision changes, tinnitus, difficulty speaking, facial droop, sore throat, chest pain, shortness of breath, abdominal pain, nausea/vomiting/diarrhea, dysuria, or weakness/numbness/paresthesias in any extremity   Physical Exam  Triage Vital Signs: ED Triage Vitals  Encounter Vitals Group     BP 08/11/23 0643 128/75     Systolic BP Percentile --      Diastolic BP Percentile --      Pulse Rate 08/11/23 0643 95     Resp 08/11/23 0643 17     Temp 08/11/23 0643 98.4 F (36.9 C)     Temp Source 08/11/23 0643 Oral     SpO2 08/11/23 0643 100 %     Weight 08/11/23 0642 176 lb (79.8 kg)     Height 08/11/23 0642 5\' 5"  (1.651 m)     Head Circumference --      Peak Flow --      Pain Score 08/11/23 0641 9     Pain Loc --      Pain Education --      Exclude from Growth Chart --    Most recent vital signs: Vitals:   08/11/23 0643  BP: 128/75  Pulse: 95  Resp: 17  Temp: 98.4 F (36.9 C)  SpO2: 100%   General: Awake, oriented x4. CV:  Good peripheral perfusion.  Resp:  Normal effort.  Abd:  No distention.  Other:  Middle-aged overweight African-American female resting comfortably in no acute distress.  There is a 2 cm diameter area of induration, edema, and central area of purulent drainage ED Results / Procedures / Treatments  Labs (all labs ordered are listed, but only abnormal results are  displayed) Labs Reviewed - No data to display PROCEDURES: Critical Care performed: No Procedures MEDICATIONS ORDERED IN ED: Medications - No data to display IMPRESSION / MDM / ASSESSMENT AND PLAN / ED COURSE  I reviewed the triage vital signs and the nursing notes.                             The patient is on the cardiac monitor to evaluate for evidence of arrhythmia and/or significant heart rate changes. Patient's presentation is most consistent with acute presentation with potential threat to life or bodily function. Presentation most consistent with simple Abscess. Given History, Exam, and Workup I have low suspicion for Cellulitis, Necrotizing Fasciitis, Pyomyositis, Sporotrichosis, Osteomyelitis or other emergent problem as a cause for this presentation. Interventions: none necessary as wound is already open and draining Rx: Bactrim BID x 5 days Disposition: Patient is stable for discharge, advised to follow up with primary care physician in 48 hours.   FINAL CLINICAL IMPRESSION(S) / ED DIAGNOSES   Final diagnoses:  Abscess of buttock, right   Rx / DC Orders   ED Discharge Orders          Ordered  Ambulatory Referral to Primary Care (Establish Care)        08/11/23 0856    sulfamethoxazole-trimethoprim (BACTRIM DS) 800-160 MG tablet  2 times daily        08/11/23 0856    HYDROcodone-acetaminophen (NORCO) 5-325 MG tablet  Every 4 hours PRN        08/11/23 0856           Note:  This document was prepared using Dragon voice recognition software and may include unintentional dictation errors.   Merwyn Katos, MD 08/11/23 [phone]

## 2024-09-11 ENCOUNTER — Emergency Department: Admission: EM | Admit: 2024-09-11 | Discharge: 2024-09-11 | Disposition: A | Discharge status: Home / Self Care

## 2024-09-11 ENCOUNTER — Other Ambulatory Visit: Payer: Self-pay

## 2024-09-11 DIAGNOSIS — L02414 Cutaneous abscess of left upper limb: Secondary | ICD-10-CM | POA: Diagnosis not present

## 2024-09-11 DIAGNOSIS — M7989 Other specified soft tissue disorders: Secondary | ICD-10-CM | POA: Diagnosis present

## 2024-09-11 DIAGNOSIS — L02412 Cutaneous abscess of left axilla: Secondary | ICD-10-CM

## 2024-09-11 MED ORDER — DOXYCYCLINE HYCLATE 100 MG PO TABS
100.0000 mg | ORAL_TABLET | Freq: Once | ORAL | Status: AC
  Administered 2024-09-11: 100 mg via ORAL
  Filled 2024-09-11: qty 1

## 2024-09-11 MED ORDER — LIDOCAINE HCL (PF) 1 % IJ SOLN
5.0000 mL | Freq: Once | INTRAMUSCULAR | Status: AC
  Administered 2024-09-11: 5 mL
  Filled 2024-09-11: qty 5

## 2024-09-11 MED ORDER — TRAMADOL HCL 50 MG PO TABS: 50.0000 mg | ORAL_TABLET | Freq: Four times a day (QID) | ORAL | 0 refills | Status: AC | PRN

## 2024-09-11 MED ORDER — IBUPROFEN 800 MG PO TABS
800.0000 mg | ORAL_TABLET | Freq: Once | ORAL | Status: AC
  Administered 2024-09-11: 800 mg via ORAL
  Filled 2024-09-11: qty 1

## 2024-09-11 MED ORDER — DOXYCYCLINE HYCLATE 100 MG PO TABS: 100.0000 mg | ORAL_TABLET | Freq: Two times a day (BID) | ORAL | 0 refills | Status: AC

## 2024-09-11 NOTE — ED Notes (Signed)
 See triage note. Approx 2.5cm raised lesion in right axilla. No drainage evident. Pt describes 10/10 pain when pressure applied to lesion. Denies F/C, NVD. Pt A&Ox4 and ambulatory.

## 2024-09-11 NOTE — ED Provider Notes (Signed)
 [facility] Provider Note    Event Date/Time   First MD Initiated Contact with Patient 09/11/24 2047     (approximate)   History   Abscess    HPI  Carly Shepard is a 43 y.o. female    with a past medical history of abscess of the right buttock, with no significant past medical history who presents to the ED complaining of abscess. According to the patient, symptoms started 2 days ago after shaving under her right arm and her arm.  Patient endorses having a lump in that area for months and after she shaved the area it starting growing.  Patient denies fever, chills.  Patient is here by herself    There are no active problems to display for this patient.    Physical Exam   Triage Vital Signs: ED Triage Vitals  Encounter Vitals Group     BP 09/11/24 1959 124/71     Girls Systolic BP Percentile --      Girls Diastolic BP Percentile --      Boys Systolic BP Percentile --      Boys Diastolic BP Percentile --      Pulse Rate 09/11/24 1959 89     Resp 09/11/24 1959 15     Temp 09/11/24 1959 97.9 F (36.6 C)     Temp Source 09/11/24 1959 Oral     SpO2 09/11/24 1959 100 %     Weight --      Height --      Head Circumference --      Peak Flow --      Pain Score 09/11/24 2000 8     Pain Loc --      Pain Education --      Exclude from Growth Chart --     Most recent vital signs: Vitals:   09/11/24 1959  BP: 124/71  Pulse: 89  Resp: 15  Temp: 97.9 F (36.6 C)  SpO2: 100%     Physical Exam Vitals and nursing note reviewed.  In triage vital signs were normal, patient was afebrile.  General:          Awake, no distress.  CV:                  Good peripheral perfusion.  Resp:               Normal effort. no tachypnea Abd:                 No distention.  Soft nontender Other:              Left underarm: Presence of abscess about 6 cm.  Tender to palpation.  Small area of fluctuance..  No drainage ED Results / Procedures / Treatments    Labs (all labs ordered are listed, but only abnormal results are displayed) Labs Reviewed - No data to display  PROCEDURES:  Critical Care performed:   .Incision and Drainage  Date/Time: 09/11/2024 10:41 PM  Performed by: Janit Kast, PA-C Authorized by: Janit Kast, PA-C   Consent:    Consent obtained:  Verbal   Consent given by:  Patient   Risks, benefits, and alternatives were discussed: yes     Risks discussed:  Pain Universal protocol:    Procedure explained and questions answered to patient or proxy's satisfaction: yes     Patient identity confirmed:  Verbally with patient Location:    Type:  Abscess  Location:  Upper extremity   Upper extremity location:  Arm   Arm location:  L upper arm Pre-procedure details:    Skin preparation:  Chlorhexidine Sedation:    Sedation type:  None Anesthesia:    Anesthesia method:  None Procedure type:    Complexity:  Simple Procedure details:    Ultrasound guidance: no     Needle aspiration: no     Incision depth:  Subcutaneous   Wound management:  Probed and deloculated   Drainage:  Purulent   Drainage amount:  Moderate   Wound treatment:  Wound left open   Packing materials:  1/4 in iodoform gauze Post-procedure details:    Procedure completion:  Tolerated well, no immediate complications    MEDICATIONS ORDERED IN ED: Medications  lidocaine  (PF) (XYLOCAINE ) 1 % injection 5 mL (has no administration in time range)  doxycycline  (VIBRA -TABS) tablet 100 mg (has no administration in time range)      IMPRESSION / MDM / ASSESSMENT AND PLAN / ED COURSE  I reviewed the triage vital signs and the nursing notes.  Differential diagnosis includes, but is not limited to, abscess, cellulitis, unlikely necrotizing fasciitis.  Patient's presentation is most consistent with acute, uncomplicated illness.   Carly Shepard is a 43 y.o., female who presents today with history of 2 days of abscess in the left underarm.   Patient endorses symptoms started after she shaved the area.  Patient reports there was a lump before.  On a physical exam, vital signs were normal.  Presence of abscess about 6 cm in the left underarm area.  Tenderness to palpation.  Small area of fluctuance in the center.  No lymphadenopathy.  No drainage. Plan I&D Doxycycline  Patient's diagnosis is consistent with left underarm abscess. I did not order any imaging or labs, physical exam was reassuring. I did review the patient's allergies and medications.The patient is in stable and satisfactory condition for discharge home  Patient will be discharged home with prescriptions for doxycycline . Patient is to follow up with PCP as needed or otherwise directed. Patient is given ED precautions to return to the ED for any worsening or new symptoms. Discussed plan of care with patient, answered all of patient's questions, Patient agreeable to plan of care. Advised patient to take medications according to the instructions on the label. Discussed possible side effects of new medications. Patient verbalized understanding.  FINAL CLINICAL IMPRESSION(S) / ED DIAGNOSES   Final diagnoses:  Abscess of left axilla     Rx / DC Orders   ED Discharge Orders          Ordered    doxycycline  (VIBRA -TABS) 100 MG tablet  2 times daily        09/11/24 2246             Note:  This document was prepared using Dragon voice recognition software and may include unintentional dictation errors.   Janit Kast, PA-C 09/11/24 2247    Fernand Rossie HERO, MD 09/11/24 [phone]

## 2024-09-11 NOTE — ED Triage Notes (Signed)
 Pt presents with abscess to left under arm worsening over the past four days. No redness. No drainage.

## 2024-09-11 NOTE — Discharge Instructions (Signed)
 You have been diagnosed with abscess of left axilla.  Please take doxycycline  1 tablet by mouth every 12 hours after main meals for 10 days.  Please take the pill with water no with milk.  Please do not lay down after taking the medication.  You need to wait 30 minutes to prevent esophagus injury.  Please move the dressing in 24 hours.  Please come back to ED or go to your PCP if you have any symptoms symptoms worsen.  Was a pleasure to help you today Lorane Sar PA-C.
